# Patient Record
Sex: Female | Born: 1975 | Race: White | Hispanic: No | Marital: Married | Smoking: Never smoker
Health system: Southern US, Community
[De-identification: ages and names within clinical notes are randomized; demographics above are authoritative.]

## PROBLEM LIST (undated history)

## (undated) DIAGNOSIS — F329 Major depressive disorder, single episode, unspecified: Secondary | ICD-10-CM

## (undated) DIAGNOSIS — F419 Anxiety disorder, unspecified: Secondary | ICD-10-CM

## (undated) DIAGNOSIS — F32A Depression, unspecified: Secondary | ICD-10-CM

## (undated) HISTORY — DX: Major depressive disorder, single episode, unspecified: F32.9

## (undated) HISTORY — DX: Depression, unspecified: F32.A

## (undated) HISTORY — DX: Anxiety disorder, unspecified: F41.9

---

## 2005-10-03 ENCOUNTER — Ambulatory Visit: Payer: Self-pay | Admitting: Internal Medicine

## 2007-01-28 DIAGNOSIS — F411 Generalized anxiety disorder: Secondary | ICD-10-CM | POA: Insufficient documentation

## 2007-01-28 DIAGNOSIS — F329 Major depressive disorder, single episode, unspecified: Secondary | ICD-10-CM | POA: Insufficient documentation

## 2007-01-28 DIAGNOSIS — F3289 Other specified depressive episodes: Secondary | ICD-10-CM | POA: Insufficient documentation

## 2015-04-02 ENCOUNTER — Other Ambulatory Visit: Payer: Self-pay | Admitting: Radiology

## 2015-04-02 ENCOUNTER — Ambulatory Visit (INDEPENDENT_AMBULATORY_CARE_PROVIDER_SITE_OTHER): Payer: 59 | Admitting: Urgent Care

## 2015-04-02 VITALS — BP 102/60 | HR 76 | Temp 98.4°F | Resp 16 | Ht 65.5 in | Wt 125.0 lb

## 2015-04-02 DIAGNOSIS — K130 Diseases of lips: Secondary | ICD-10-CM | POA: Diagnosis not present

## 2015-04-02 DIAGNOSIS — F419 Anxiety disorder, unspecified: Secondary | ICD-10-CM

## 2015-04-02 DIAGNOSIS — L509 Urticaria, unspecified: Secondary | ICD-10-CM | POA: Diagnosis not present

## 2015-04-02 DIAGNOSIS — R21 Rash and other nonspecific skin eruption: Secondary | ICD-10-CM | POA: Diagnosis not present

## 2015-04-02 DIAGNOSIS — R457 State of emotional shock and stress, unspecified: Secondary | ICD-10-CM

## 2015-04-02 DIAGNOSIS — R220 Localized swelling, mass and lump, head: Secondary | ICD-10-CM

## 2015-04-02 MED ORDER — PREDNISONE 20 MG PO TABS: ORAL_TABLET | ORAL | Status: DC

## 2015-04-02 MED ORDER — LORAZEPAM 0.5 MG PO TABS: 0.5000 mg | ORAL_TABLET | Freq: Every day | ORAL | Status: AC

## 2015-04-02 NOTE — Patient Instructions (Signed)
You can also take Zyrtec with Zantac or Pepcid for antihistamine properties.   Hives Hives are itchy, red, swollen areas of the skin. They can vary in size and location on your body. Hives can come and go for hours or several days (acute hives) or for several weeks (chronic hives). Hives do not spread from person to person (noncontagious). They may get worse with scratching, exercise, and emotional stress. CAUSES   Allergic reaction to food, additives, or drugs.  Infections, including the common cold.  Illness, such as vasculitis, lupus, or thyroid disease.  Exposure to sunlight, heat, or cold.  Exercise.  Stress.  Contact with chemicals. SYMPTOMS   Red or white swollen patches on the skin. The patches may change size, shape, and location quickly and repeatedly.  Itching.  Swelling of the hands, feet, and face. This may occur if hives develop deeper in the skin. DIAGNOSIS  Your caregiver can usually tell what is wrong by performing a physical exam. Skin or blood tests may also be done to determine the cause of your hives. In some cases, the cause cannot be determined. TREATMENT  Mild cases usually get better with medicines such as antihistamines. Severe cases may require an emergency epinephrine injection. If the cause of your hives is known, treatment includes avoiding that trigger.  HOME CARE INSTRUCTIONS   Avoid causes that trigger your hives.  Take antihistamines as directed by your caregiver to reduce the severity of your hives. Non-sedating or low-sedating antihistamines are usually recommended. Do not drive while taking an antihistamine.  Take any other medicines prescribed for itching as directed by your caregiver.  Wear loose-fitting clothing.  Keep all follow-up appointments as directed by your caregiver. SEEK MEDICAL CARE IF:   You have persistent or severe itching that is not relieved with medicine.  You have painful or swollen joints. SEEK IMMEDIATE MEDICAL  CARE IF:   You have a fever.  Your tongue or lips are swollen.  You have trouble breathing or swallowing.  You feel tightness in the throat or chest.  You have abdominal pain. These problems may be the first sign of a life-threatening allergic reaction. Call your local emergency services (911 in [place] MAKE SURE YOU:   Understand these instructions.  Will watch your condition.  Will get help right away if you are not doing well or get worse.   This information is not intended to replace advice given to you by your health care provider. Make sure you discuss any questions you have with your health care provider.   Document Released: 05/06/2005 Document Revised: 05/11/2013 Document Reviewed: 07/30/2011 Elsevier Interactive Patient Education [employer]2016 Elsevier Inc.

## 2015-04-02 NOTE — Progress Notes (Signed)
    MRN: [id number] DOB: 01/05/1976  Subjective:   Carol Romero is a 39 y.o. female presenting for chief complaint of Urticaria and Sore Throat  Reports 3 day history of rash, lip swelling, finger swelling. Has tried benadryl and Claritin without any relief. Denies fever, chest tightness, shob, wheezing, tongue swelling, throat closing, n/v, abdominal pain. Of note, patient has a history of breakouts like this. She has previously undergone allergy testing with normal results. Several providers have diagnosed her with stressed induced urticaria. Patient has responded well to steroid course in the past. Her last breakout was ~8 years ago. She admits significant stress with the election results, recent travel and moving from a different country. Has not tried any new medications, foods, changed detergents or cleaning agents, no contact with poisonous plants. Regarding medications, patient is currently breastfeeding. Has been trying Xanax for her anxiety without much relief, no significant changes with her baby. Denies any other aggravating or relieving factors, no other questions or concerns.  Carol Romero has a current medication list which includes the following prescription(s): alprazolam and escitalopram. Also is allergic to ibuprofen.  Carol Romero  has a past medical history of Depression and Anxiety. Also  has no past surgical history on file.  Objective:   Vitals: BP 102/60 mmHg  Pulse 76  Temp(Src) 98.4 F (36.9 C) (Oral)  Resp 16  Ht 5' 5.5" (1.664 m)  Wt 125 lb (56.7 kg)  BMI 20.48 kg/m2  SpO2 99%  Physical Exam  Constitutional: She is oriented to person, place, and time. She appears well-developed and well-nourished.  HENT:  Right Ear: External ear normal.  Left Ear: External ear normal.  Mouth/Throat: Oropharynx is clear and moist.  Upper lip mild-moderately swollen.  Eyes: Right eye exhibits no discharge. Left eye exhibits no discharge. No scleral icterus.  Neck: Normal range of  motion. Neck supple.  Cardiovascular: Normal rate, regular rhythm and intact distal pulses.  Exam reveals no gallop and no friction rub.   No murmur heard. Pulmonary/Chest: No stridor. No respiratory distress. She has no wheezes. She has no rales.  Abdominal: Soft. Bowel sounds are normal. She exhibits no distension and no mass. There is no tenderness.  Musculoskeletal: She exhibits no edema.  Lymphadenopathy:    She has no cervical adenopathy.  Neurological: She is alert and oriented to person, place, and time.  Skin: Skin is warm and dry. Rash (multiple urticarial lesions in patches throughout her entire body) noted. There is erythema. No pallor.  Psychiatric: Her mood appears anxious.   Assessment and Plan :   1. Hives 2. Lip swelling 3. Rash and nonspecific skin eruption 4. Emotional stress 5. Anxiety - Counseled patient on risks of using steroid, patient verbalized understanding. Start short steroid course, oral antihistamines. Try Ativan for stress and anxiety. Counseled patient on worsening signs and symptoms of allergic reaction.  Wallis BambergMario Johnmark Geiger, PA-C [facility] [phone] 04/02/2015 10:21 AM

## 2015-04-02 NOTE — Telephone Encounter (Signed)
Spoke to patient she states she was missing Rx for prednisone I advised her this was sent to [facility] for her she voiced understanding

## 2015-04-07 ENCOUNTER — Other Ambulatory Visit: Payer: Self-pay | Admitting: Family Medicine

## 2015-04-07 DIAGNOSIS — L509 Urticaria, unspecified: Secondary | ICD-10-CM

## 2015-04-07 DIAGNOSIS — R220 Localized swelling, mass and lump, head: Secondary | ICD-10-CM

## 2015-04-07 DIAGNOSIS — R21 Rash and other nonspecific skin eruption: Secondary | ICD-10-CM

## 2015-04-07 MED ORDER — PREDNISONE 20 MG PO TABS: ORAL_TABLET | ORAL | Status: DC

## 2015-04-07 NOTE — Progress Notes (Signed)
Phone message from Dr. Darlina Sicilianim Franzel, patients father, hives are 80% better but not resolved. No new sxs. Prednisone refilled she will take 40 mg daily for 5 days. F/U in office if any worsening.

## 2015-04-17 ENCOUNTER — Ambulatory Visit (INDEPENDENT_AMBULATORY_CARE_PROVIDER_SITE_OTHER): Payer: 59 | Admitting: Family Medicine

## 2015-04-17 ENCOUNTER — Ambulatory Visit (INDEPENDENT_AMBULATORY_CARE_PROVIDER_SITE_OTHER): Payer: 59

## 2015-04-17 ENCOUNTER — Encounter: Payer: Self-pay | Admitting: Family Medicine

## 2015-04-17 VITALS — BP 90/60 | HR 59 | Temp 98.0°F | Resp 16 | Ht 66.5 in | Wt 129.0 lb

## 2015-04-17 DIAGNOSIS — R220 Localized swelling, mass and lump, head: Secondary | ICD-10-CM

## 2015-04-17 DIAGNOSIS — D6489 Other specified anemias: Secondary | ICD-10-CM

## 2015-04-17 DIAGNOSIS — M2550 Pain in unspecified joint: Secondary | ICD-10-CM | POA: Diagnosis not present

## 2015-04-17 DIAGNOSIS — K130 Diseases of lips: Secondary | ICD-10-CM

## 2015-04-17 DIAGNOSIS — R21 Rash and other nonspecific skin eruption: Secondary | ICD-10-CM | POA: Diagnosis not present

## 2015-04-17 DIAGNOSIS — L509 Urticaria, unspecified: Secondary | ICD-10-CM | POA: Diagnosis not present

## 2015-04-17 DIAGNOSIS — M25541 Pain in joints of right hand: Secondary | ICD-10-CM | POA: Diagnosis not present

## 2015-04-17 DIAGNOSIS — F411 Generalized anxiety disorder: Secondary | ICD-10-CM

## 2015-04-17 LAB — IBC PANEL
%SAT: 49 % (ref 11–50)
TIBC: 317 ug/dL (ref 250–450)
UIBC: 161 ug/dL (ref 125–400)

## 2015-04-17 LAB — CBC WITH DIFFERENTIAL/PLATELET
BASOS PCT: 0 % (ref 0–1)
Basophils Absolute: 0 10*3/uL (ref 0.0–0.1)
EOS ABS: 0.3 10*3/uL (ref 0.0–0.7)
Eosinophils Relative: 3 % (ref 0–5)
HCT: 34.4 % — ABNORMAL LOW (ref 36.0–46.0)
Hemoglobin: 11.5 g/dL — ABNORMAL LOW (ref 12.0–15.0)
Lymphocytes Relative: 25 % (ref 12–46)
Lymphs Abs: 2.2 10*3/uL (ref 0.7–4.0)
MCH: 29.9 pg (ref 26.0–34.0)
MCHC: 33.4 g/dL (ref 30.0–36.0)
MCV: 89.6 fL (ref 78.0–100.0)
MONOS PCT: 5 % (ref 3–12)
MPV: 10.8 fL (ref 8.6–12.4)
Monocytes Absolute: 0.4 10*3/uL (ref 0.1–1.0)
NEUTROS PCT: 67 % (ref 43–77)
Neutro Abs: 5.8 10*3/uL (ref 1.7–7.7)
PLATELETS: 270 10*3/uL (ref 150–400)
RBC: 3.84 MIL/uL — ABNORMAL LOW (ref 3.87–5.11)
RDW: 13.4 % (ref 11.5–15.5)
WBC: 8.7 10*3/uL (ref 4.0–10.5)

## 2015-04-17 LAB — COMPREHENSIVE METABOLIC PANEL
ALT: 11 U/L (ref 6–29)
AST: 15 U/L (ref 10–30)
Albumin: 4.2 g/dL (ref 3.6–5.1)
Alkaline Phosphatase: 41 U/L (ref 33–115)
BUN: 13 mg/dL (ref 7–25)
CHLORIDE: 105 mmol/L (ref 98–110)
CO2: 26 mmol/L (ref 20–31)
CREATININE: 0.67 mg/dL (ref 0.50–1.10)
Calcium: 8.8 mg/dL (ref 8.6–10.2)
GLUCOSE: 88 mg/dL (ref 65–99)
Potassium: 4.1 mmol/L (ref 3.5–5.3)
SODIUM: 138 mmol/L (ref 135–146)
TOTAL PROTEIN: 6.4 g/dL (ref 6.1–8.1)
Total Bilirubin: 0.3 mg/dL (ref 0.2–1.2)

## 2015-04-17 LAB — TSH: TSH: 2.662 u[IU]/mL (ref 0.350–4.500)

## 2015-04-17 LAB — VITAMIN B12: VITAMIN B 12: 333 pg/mL (ref 211–911)

## 2015-04-17 LAB — T4, FREE: Free T4: 1.2 ng/dL (ref 0.80–1.80)

## 2015-04-17 LAB — C-REACTIVE PROTEIN: CRP: <0.5 mg/dL (ref <0.60)

## 2015-04-17 LAB — HEMOGLOBIN A1C
HEMOGLOBIN A1C: 5.3 % (ref <5.7)
Mean Plasma Glucose: 105 mg/dL (ref <117)

## 2015-04-17 LAB — IRON: IRON: 156 ug/dL (ref 40–190)

## 2015-04-17 LAB — RHEUMATOID FACTOR: Rhuematoid fact SerPl-aCnc: <10 IU/mL (ref <=14)

## 2015-04-17 LAB — CK: Total CK: 32 U/L (ref 7–177)

## 2015-04-17 MED ORDER — PREDNISONE 20 MG PO TABS: ORAL_TABLET | ORAL | Status: AC

## 2015-04-17 NOTE — Progress Notes (Signed)
Subjective:    Patient ID: Carol AlbertAdrienne Romero, female    DOB: 11/04/1975, 39 y.o.   MRN: [id number]  04/17/2015  Hives and joint pain   HPI This 39 y.o. female presents for evaluation of urticaria.  Two weeks ago, developed severe hives and angioedema.  Prescribed Prednisone x 2 courses for tend days; 40mg  daily.  Five day course and then extended it for five days. Hives are 95% resolved.  Still gets hives intermittently.  Itchiness has greatly improved.   Ten days ago, started having joint pain; hoping it would resolve.  Severe fatigue as well.  Feels exhausted.  R second finger with pain; also L wrist which is now resolved.  MCP and PIP.  L shoulder pain and R knee pain upon awakening. Some neck tenderness B trapezius regions.  L wrist is most significant.   Sometimes pain does not resolve. Took Ibuprofen this morning; had continuous pain all day.  No sore throat with onset.  Did have a scratchy throat at last visit; also had heartburn; also thought maybe throat was swelling; also had angioedema that same day.  No significant swelling but has had some swelling; R MCP is red; some redness around some joints.  Pain is worse than redness or swelling. No fevers/chills/sweats.  Father with hives and angioedema.  S/p allergy consultation in 2011 for hives ;s/p two to three evaluations in the past; no known allergies; father also with negtaive work up in the past with Dr. Lucie LeatherKozlow.   Stress is trigger for patient and father.  Wanted to rule out RA or other autoimmune process.    History of hives in the past; usually takes 2 weeks to clear.  This is similar to other episodes.  Has never had joint pain in past.    Mother's family with thyroid disease; mother with nodule and thyroid resection. Father with hives.   Pap smear UTD. Pernicious anemia in grandfather with advanced age.  Chronic anemia.     Review of Systems  Constitutional: Negative for fever, chills, diaphoresis and fatigue.  HENT: Negative for  congestion, ear discharge, ear pain, facial swelling, mouth sores, sore throat, trouble swallowing and voice change.   Eyes: Negative for visual disturbance.  Respiratory: Negative for cough and shortness of breath.   Cardiovascular: Negative for chest pain, palpitations and leg swelling.  Gastrointestinal: Negative for nausea, vomiting, abdominal pain, diarrhea and constipation.  Endocrine: Negative for cold intolerance, heat intolerance, polydipsia, polyphagia and polyuria.  Genitourinary: Negative for genital sores.  Musculoskeletal: Positive for joint swelling, arthralgias, neck pain and neck stiffness.  Skin: Positive for color change and rash.  Neurological: Negative for dizziness, tremors, seizures, syncope, facial asymmetry, speech difficulty, weakness, light-headedness, numbness and headaches.  Hematological: Negative for adenopathy. Does not bruise/bleed easily.  Psychiatric/Behavioral: Negative for suicidal ideas, sleep disturbance, self-injury and dysphoric mood. The patient is nervous/anxious.     Past Medical History  Diagnosis Date  . Depression   . Anxiety    No past surgical history on file. Allergies  Allergen Reactions  . Ibuprofen     Social History   Social History  . Marital Status: Married    Spouse Name: N/A  . Number of Children: N/A  . Years of Education: N/A   Occupational History  . Not on file.   Social History Main Topics  . Smoking status: Never Smoker   . Smokeless tobacco: Not on file  . Alcohol Use: 3.0 oz/week    5 Standard drinks or  equivalent per week  . Drug Use: No  . Sexual Activity: Not on file   Other Topics Concern  . Not on file   Social History Narrative   No family history on file.     Objective:    BP 90/60 mmHg  Pulse 59  Temp(Src) 98 F (36.7 C) (Oral)  Resp 16  Ht 5' 6.5" (1.689 m)  Wt 129 lb (58.514 kg)  BMI 20.51 kg/m2  SpO2 99%  LMP 03/29/2015 Physical Exam  Constitutional: She is oriented to person,  place, and time. She appears well-developed and well-nourished. No distress.  HENT:  Head: Normocephalic and atraumatic.  Right Ear: External ear normal.  Left Ear: External ear normal.  Nose: Nose normal.  Mouth/Throat: Oropharynx is clear and moist.  Eyes: Conjunctivae and EOM are normal. Pupils are equal, round, and reactive to light.  Neck: Normal range of motion. Neck supple. Carotid bruit is not present. No thyromegaly present.  Cardiovascular: Normal rate, regular rhythm, normal heart sounds and intact distal pulses.  Exam reveals no gallop and no friction rub.   No murmur heard. Pulmonary/Chest: Effort normal and breath sounds normal. She has no wheezes. She has no rales.  Abdominal: Soft. Bowel sounds are normal. She exhibits no distension and no mass. There is no tenderness. There is no rebound and no guarding.  Musculoskeletal:       Right wrist: She exhibits swelling. She exhibits normal range of motion and no tenderness.       Left wrist: Normal.       Right hand: Normal.       Left hand: Normal.  No synovitis; mild swelling R wrist. No warmth.  Lymphadenopathy:    She has no cervical adenopathy.  Neurological: She is alert and oriented to person, place, and time. No cranial nerve deficit.  Skin: Skin is warm and dry. No rash noted. She is not diaphoretic. No erythema. No pallor.  Psychiatric: She has a normal mood and affect. Her behavior is normal.       [facility] reading (PRIMARY) by  Dr. Katrinka Blazing. R HAND: NAD   Assessment & Plan:   1. Arthralgia   2. Hives   3. Anemia due to other cause   4. Generalized anxiety disorder   5. Lip swelling   6. Rash and nonspecific skin eruption    -Recurrent. -Obtain labs to rule out evolving autoimmune process. -Rx for Prednisone provided.  -Reassuring normal hand xray in office. -s/p allergy consultation x 3 in the past.   Orders Placed This Encounter  Procedures  . DG Hand Complete Right    Standing Status: Future      Number of Occurrences: 1     Standing Expiration Date: 04/16/2016    Order Specific Question:  Reason for Exam (SYMPTOM  OR DIAGNOSIS REQUIRED)    Answer:  R second finger MCP joint pain, swelling, redness; other joint pain    Order Specific Question:  Is the patient pregnant?    Answer:  No    Order Specific Question:  Preferred imaging location?    Answer:  External  . CBC with Differential/Platelet  . Comprehensive metabolic panel  . Hemoglobin A1c  . TSH  . T4, free  . Iron  . IBC panel  . Vitamin B12  . VITAMIN D 25 Hydroxy (Vit-D Deficiency, Fractures)  . CK  . ANA  . Rheumatoid factor  . Sedimentation rate  . C-reactive protein  . Antistreptolysin O titer  .  POCT urinalysis dipstick  . POCT Microscopic Urinalysis ([facility])   Meds ordered this encounter  Medications  . predniSONE (DELTASONE) 20 MG tablet    Sig: Take 2 tablets daily    Dispense:  20 tablet    Refill:  0    No Follow-up on file.    Kristi Paulita Fujita, M.D. [facility] [address] [place], [place]  [address] [phone] phone [phone] fax

## 2015-04-17 NOTE — Patient Instructions (Signed)
Hives Hives are itchy, red, swollen areas of the skin. They can vary in size and location on your body. Hives can come and go for hours or several days (acute hives) or for several weeks (chronic hives). Hives do not spread from person to person (noncontagious). They may get worse with scratching, exercise, and emotional stress. CAUSES   Allergic reaction to food, additives, or drugs.  Infections, including the common cold.  Illness, such as vasculitis, lupus, or thyroid disease.  Exposure to sunlight, heat, or cold.  Exercise.  Stress.  Contact with chemicals. SYMPTOMS   Red or white swollen patches on the skin. The patches may change size, shape, and location quickly and repeatedly.  Itching.  Swelling of the hands, feet, and face. This may occur if hives develop deeper in the skin. DIAGNOSIS  Your caregiver can usually tell what is wrong by performing a physical exam. Skin or blood tests may also be done to determine the cause of your hives. In some cases, the cause cannot be determined. TREATMENT  Mild cases usually get better with medicines such as antihistamines. Severe cases may require an emergency epinephrine injection. If the cause of your hives is known, treatment includes avoiding that trigger.  HOME CARE INSTRUCTIONS   Avoid causes that trigger your hives.  Take antihistamines as directed by your caregiver to reduce the severity of your hives. Non-sedating or low-sedating antihistamines are usually recommended. Do not drive while taking an antihistamine.  Take any other medicines prescribed for itching as directed by your caregiver.  Wear loose-fitting clothing.  Keep all follow-up appointments as directed by your caregiver. SEEK MEDICAL CARE IF:   You have persistent or severe itching that is not relieved with medicine.  You have painful or swollen joints. SEEK IMMEDIATE MEDICAL CARE IF:   You have a fever.  Your tongue or lips are swollen.  You have  trouble breathing or swallowing.  You feel tightness in the throat or chest.  You have abdominal pain. These problems may be the first sign of a life-threatening allergic reaction. Call your local emergency services (911 in [place] MAKE SURE YOU:   Understand these instructions.  Will watch your condition.  Will get help right away if you are not doing well or get worse.   This information is not intended to replace advice given to you by your health care provider. Make sure you discuss any questions you have with your health care provider.   Document Released: 05/06/2005 Document Revised: 05/11/2013 Document Reviewed: 07/30/2011 Elsevier Interactive Patient Education 2016 Elsevier Inc.  

## 2015-04-18 LAB — VITAMIN D 25 HYDROXY (VIT D DEFICIENCY, FRACTURES): VIT D 25 HYDROXY: 22 ng/mL — AB (ref 30–100)

## 2015-04-18 LAB — SEDIMENTATION RATE: Sed Rate: 4 mm/hr (ref 0–20)

## 2015-04-19 LAB — ANA: Anti Nuclear Antibody(ANA): NEGATIVE

## 2015-04-19 LAB — ANTISTREPTOLYSIN O TITER: ASO: 139 [IU]/mL (ref <409)

## 2016-07-14 IMAGING — CR DG HAND COMPLETE 3+V*R*
3 series · 3 of 3 positions shown · non-contrast
Comparison: None

CLINICAL DATA: Arthralgia, pain, swelling and redness at second MCP
joint

EXAM:
RIGHT HAND - COMPLETE 3+ VIEW

[PA]
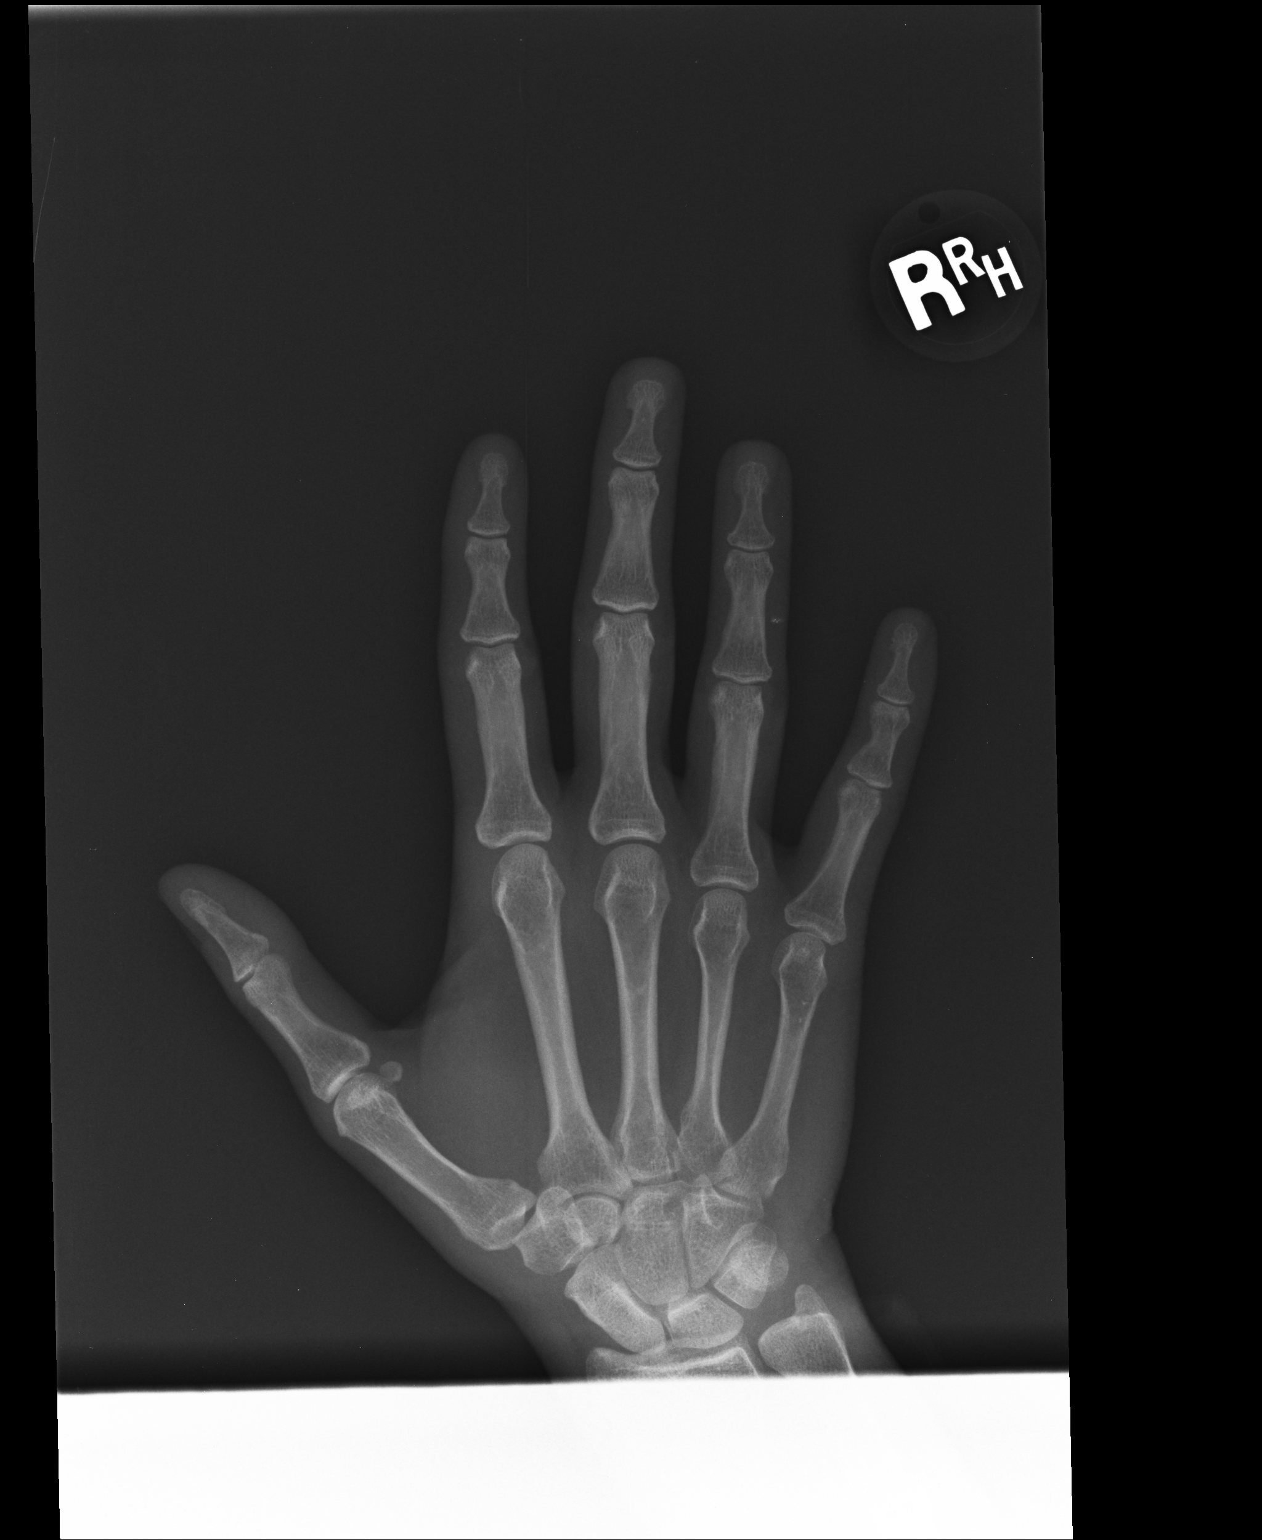

[lateral]
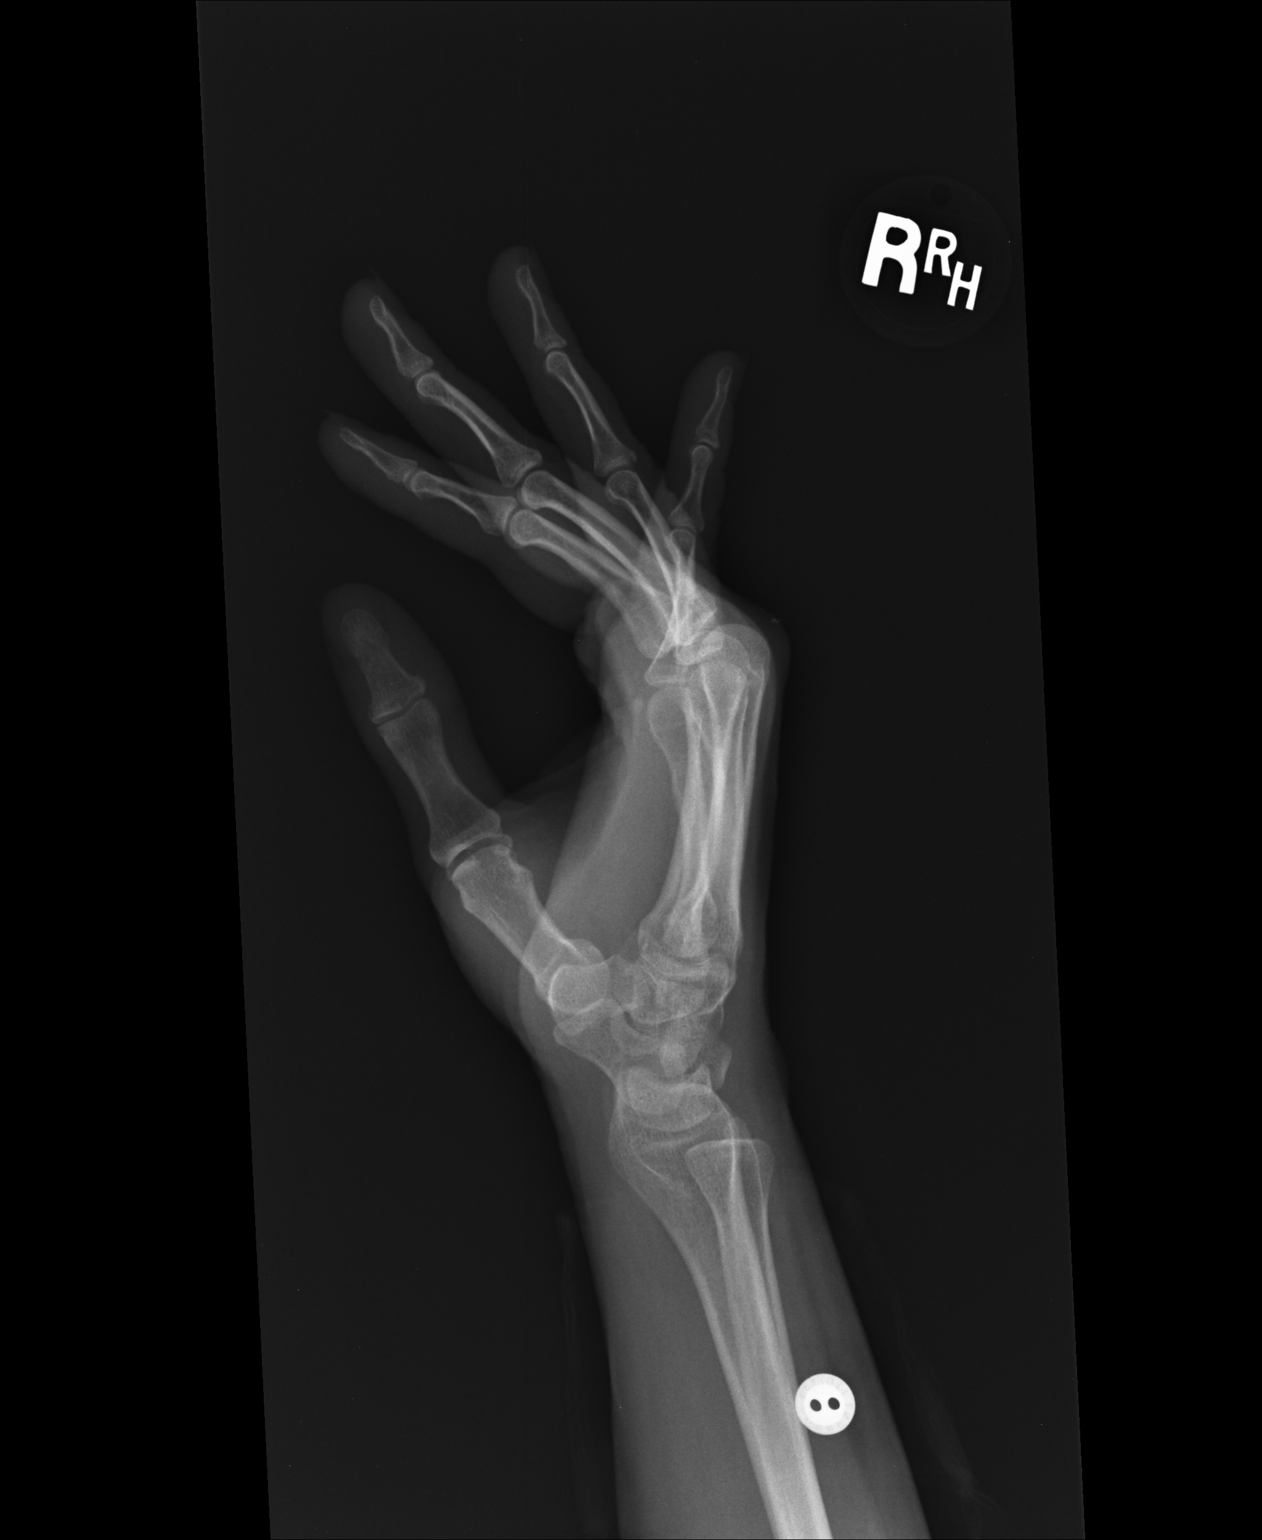

[oblique]
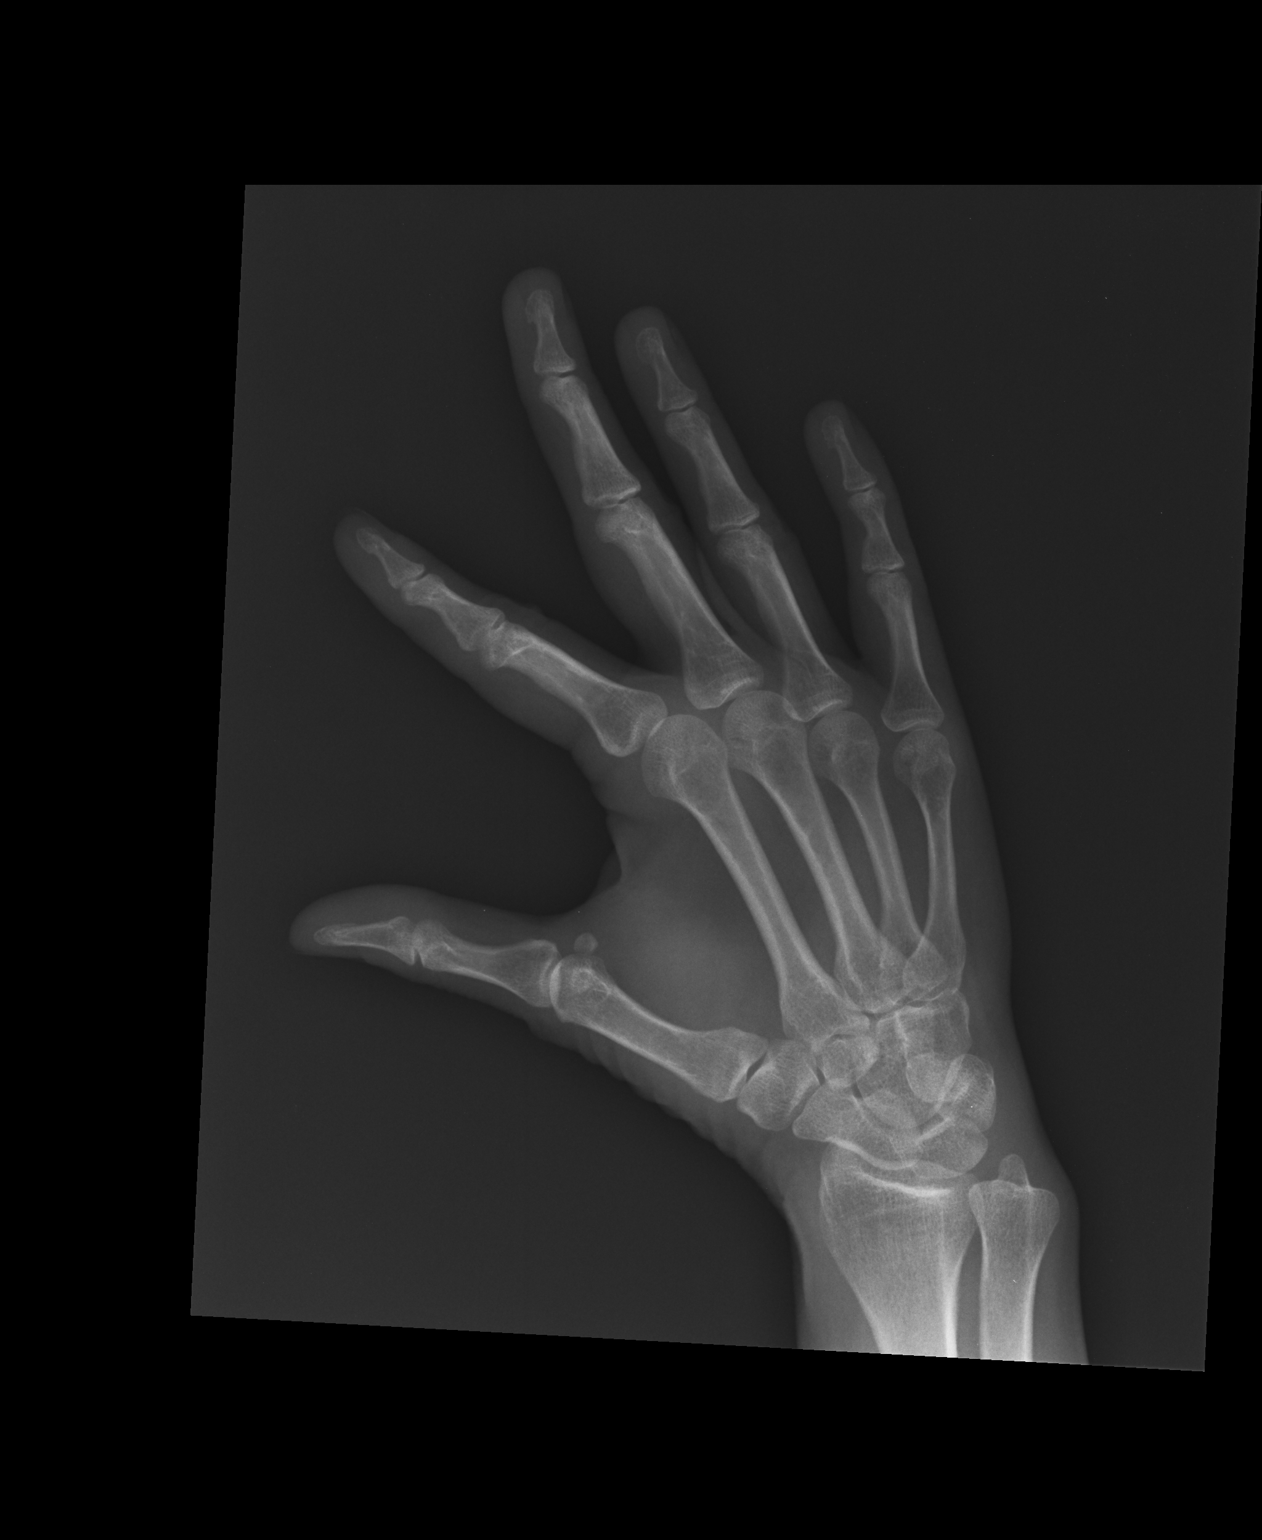

[3 of 3 positions shown; findings below may reference images not displayed]

FINDINGS: Osseous mineralization normal.

Joint spaces preserved.

No fracture, dislocation, or bone destruction.

No inflammatory or erosive changes identified.
IMPRESSION: Normal exam.
